# Patient Record
Sex: Male | Born: 1965 | Race: Black or African American | Hispanic: No | Marital: Married | Smoking: Never smoker
Health system: Southern US, Community
[De-identification: ages and names within clinical notes are randomized; demographics above are authoritative.]

## PROBLEM LIST (undated history)

## (undated) DIAGNOSIS — E119 Type 2 diabetes mellitus without complications: Secondary | ICD-10-CM

## (undated) HISTORY — PX: TRIGGER FINGER RELEASE: SHX641

---

## 2020-08-08 ENCOUNTER — Emergency Department (HOSPITAL_COMMUNITY)
Admission: EM | Admit: 2020-08-08 | Discharge: 2020-08-09 | Disposition: A | Discharge status: Home / Self Care | Payer: Federal, State, Local not specified - PPO | Attending: Emergency Medicine | Admitting: Emergency Medicine

## 2020-08-08 DIAGNOSIS — Z5321 Procedure and treatment not carried out due to patient leaving prior to being seen by health care provider: Secondary | ICD-10-CM | POA: Insufficient documentation

## 2020-08-08 DIAGNOSIS — S6991XA Unspecified injury of right wrist, hand and finger(s), initial encounter: Secondary | ICD-10-CM | POA: Insufficient documentation

## 2020-08-08 DIAGNOSIS — W07XXXA Fall from chair, initial encounter: Secondary | ICD-10-CM | POA: Diagnosis not present

## 2020-08-08 HISTORY — DX: Type 2 diabetes mellitus without complications: E11.9

## 2020-08-09 ENCOUNTER — Emergency Department (HOSPITAL_COMMUNITY): Payer: Federal, State, Local not specified - PPO

## 2020-08-09 ENCOUNTER — Encounter (HOSPITAL_COMMUNITY): Payer: Self-pay | Admitting: *Deleted

## 2020-08-09 NOTE — ED Triage Notes (Signed)
Pt tripped over a patio chair and landed onto his R hand. Swelling noted

## 2020-08-09 NOTE — ED Notes (Signed)
Called patient for vital update patient didn't answer 

## 2020-08-09 NOTE — ED Notes (Signed)
Pt is refusing vitals 

## 2022-05-15 IMAGING — CR DG HAND COMPLETE 3+V*R*
3 series · 3 of 3 positions shown · non-contrast
Comparison: None.

CLINICAL DATA: Fall fall landing on right hand, swelling abrasion,
pain of the second and third metacarpals.

EXAM:
RIGHT HAND - COMPLETE 3+ VIEW

[hand pa]
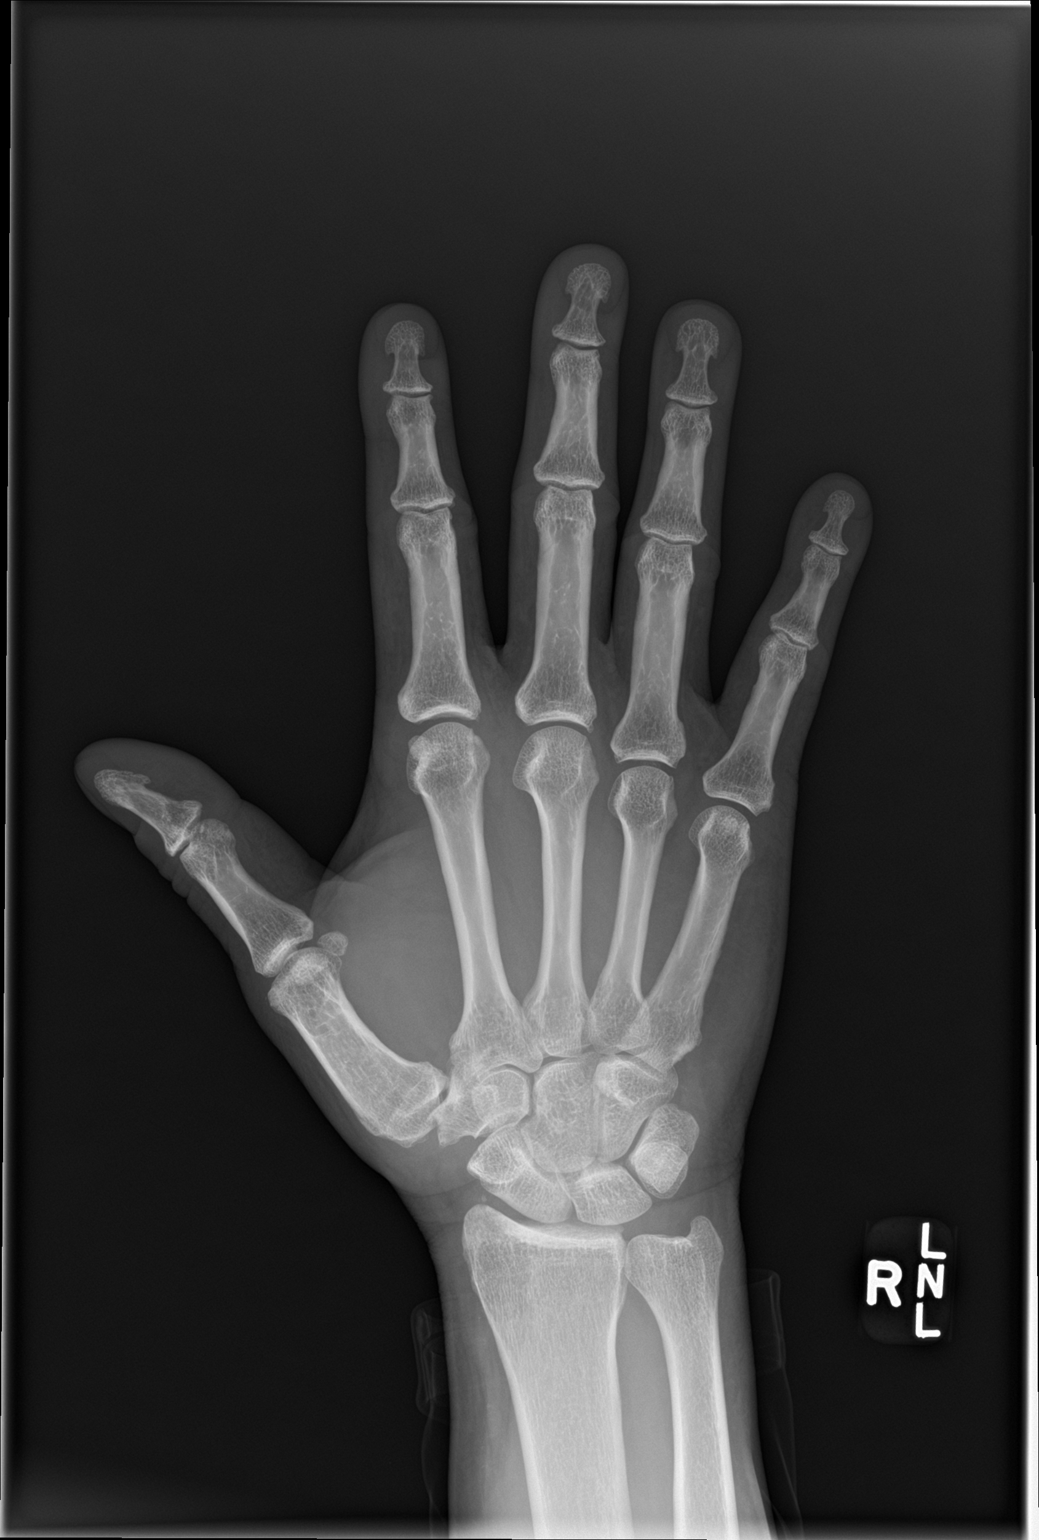

[hand obl]
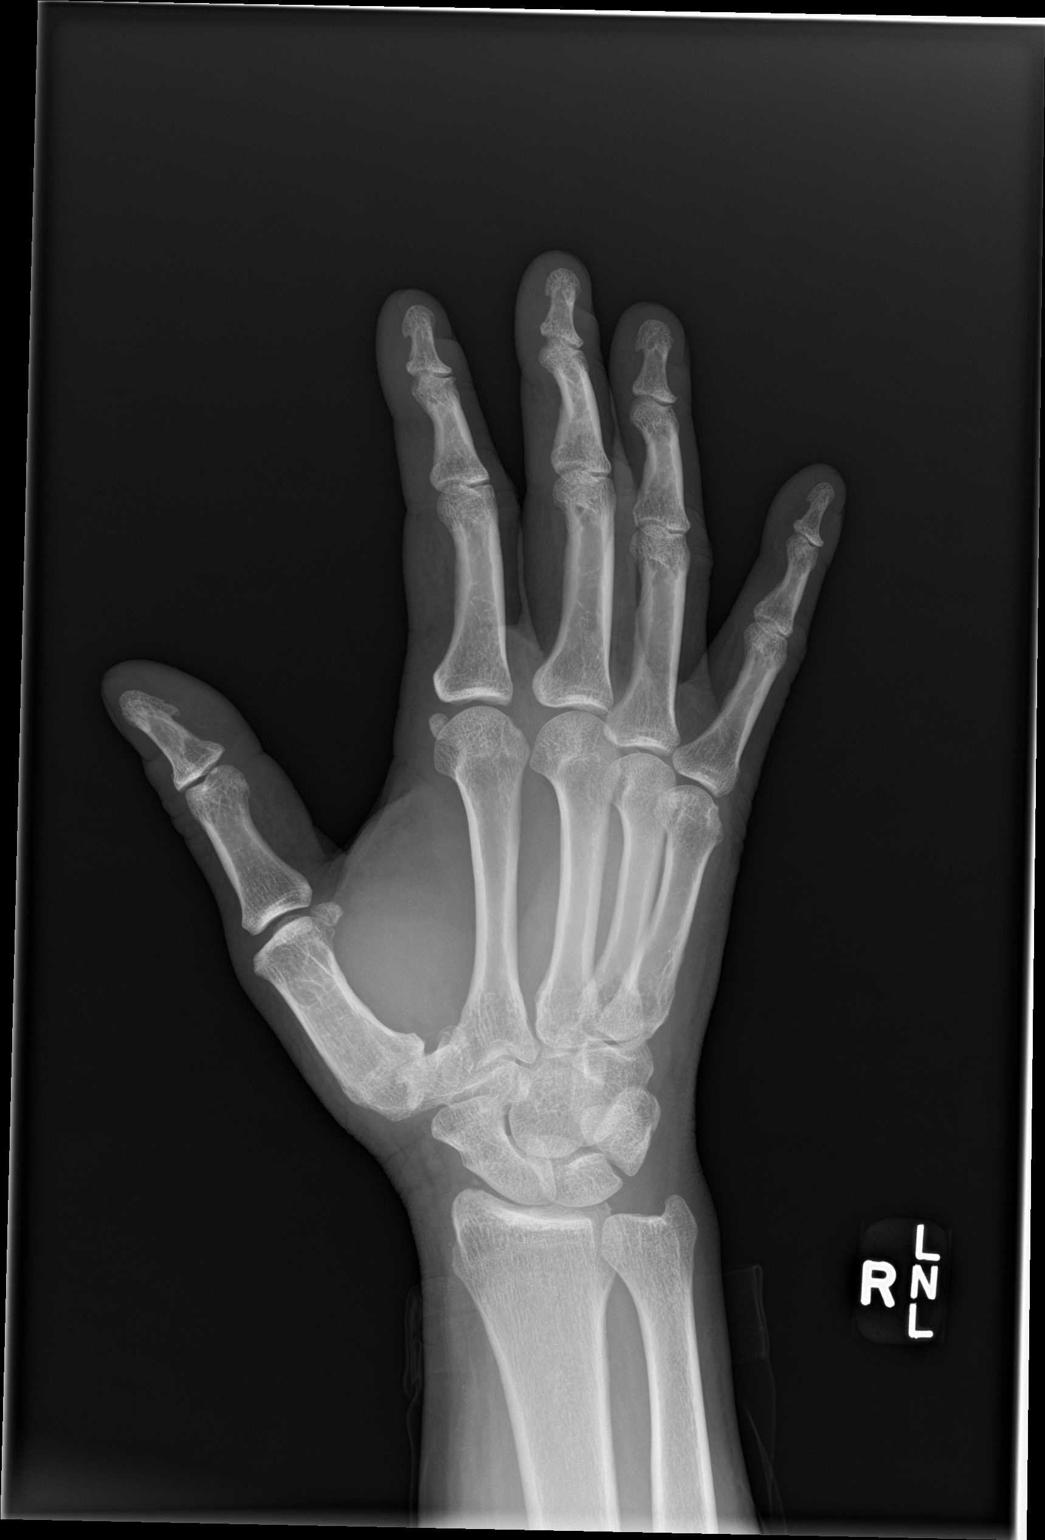

[hand lat]
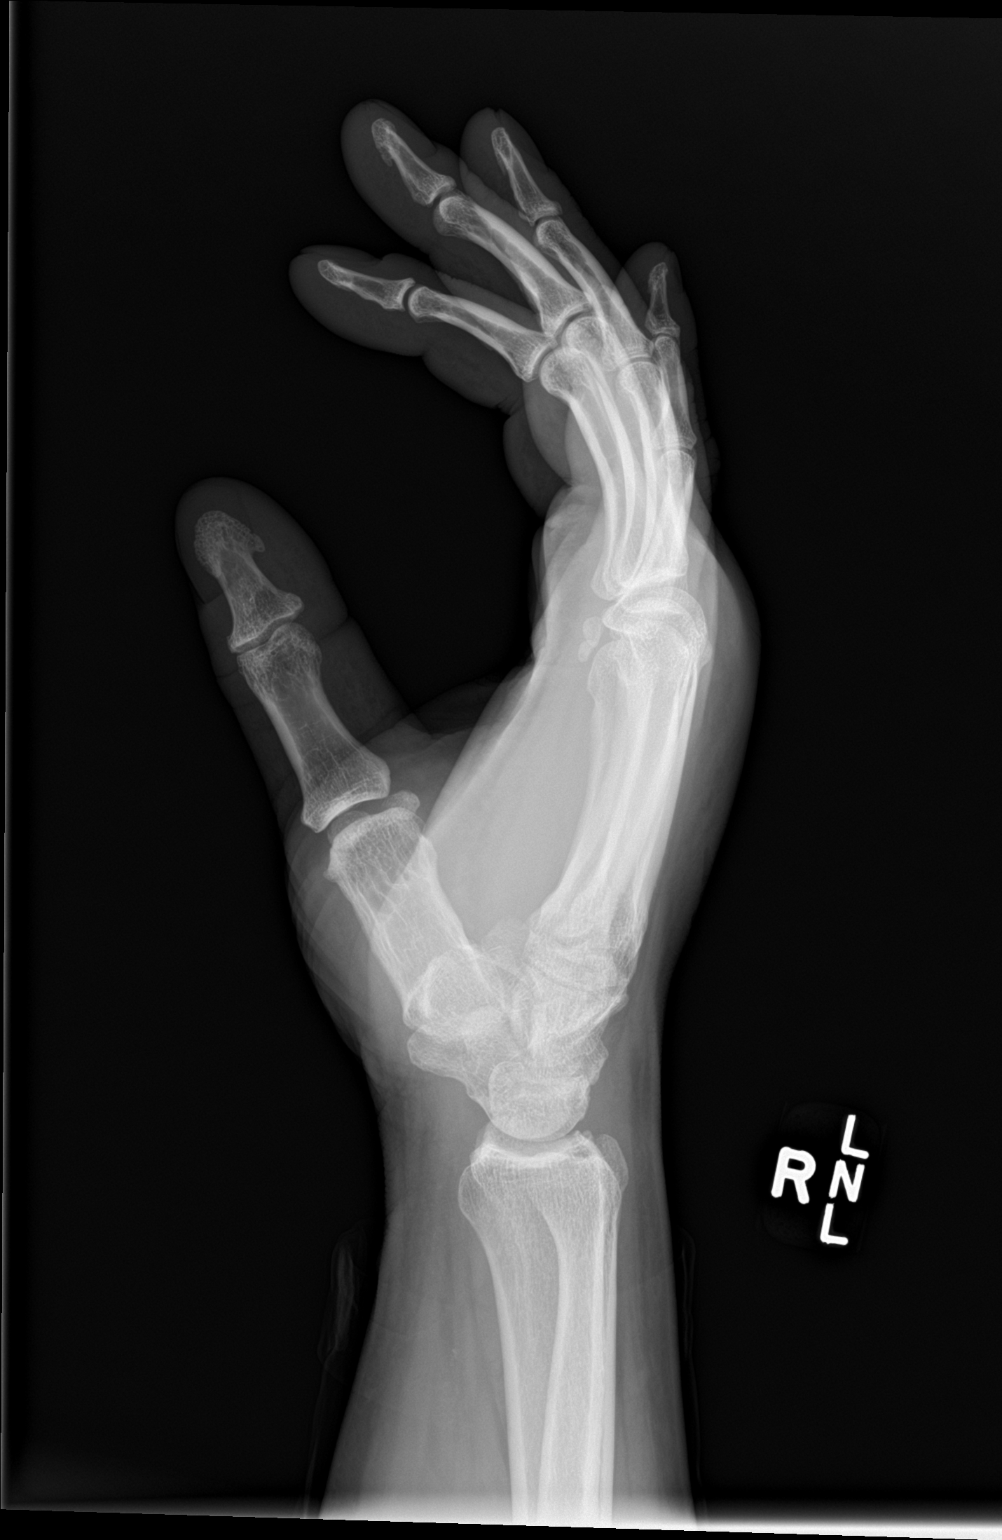

[3 of 3 positions shown; findings below may reference images not displayed]

FINDINGS: Tiny fragment seen adjacent the tip of the radial styloid could
reflect an acute cortical fragment or more degenerative change.
Correlate for point tenderness. No other acute osseous abnormality
or convincing site of fracture. Mild arthrosis of the hand and
wrist. There is no evidence of underlying inflammatory or erosive
arthropathy or other focal bone abnormality. Soft tissues are
unremarkable.
IMPRESSION: Tiny ossification adjacent the radial styloid process with adjacent
cortical lucency, could reflect a small acute fracture fragment
versus degenerative change.

No other acute fracture or traumatic malalignment.

Mild arthrosis.

## 2024-01-18 ENCOUNTER — Ambulatory Visit: Admitting: Podiatry

## 2024-01-30 ENCOUNTER — Ambulatory Visit: Admitting: Podiatry

## 2024-01-30 DIAGNOSIS — Q666 Other congenital valgus deformities of feet: Secondary | ICD-10-CM | POA: Diagnosis not present

## 2024-01-30 NOTE — Progress Notes (Signed)
"  °  Subjective:  Patient ID: William Richards, male    DOB: 1965-02-09,  MRN: [id number]  Chief Complaint  Patient presents with   Foot Pain    Pt stated that he has a lot of numbness and burning type pain     59 y.o. male presents with the above complaint.  Patient presents with complaint of diabetic neuropathy pain as well as arthritis pain.  Patient states that he is very flat-footed he is on his feet a lot denies any other acute complaints has not seen and was prior to seeing me.  He would like to discuss treatment options does not wear any orthotics he wears regular shoes.  Pain scale 7 out of 10 dull aching nature   Review of Systems: Negative except as noted in the HPI. Denies N/V/F/Ch.  Past Medical History:  Diagnosis Date   Diabetes mellitus without complication (HCC)    Current Medications[1]  Tobacco Use History[2]  Allergies[3] Objective:  There were no vitals filed for this visit. There is no height or weight on file to calculate BMI. Constitutional Well developed. Well nourished.  Vascular Dorsalis pedis pulses palpable bilaterally. Posterior tibial pulses palpable bilaterally. Capillary refill normal to all digits.  No cyanosis or clubbing noted. Pedal hair growth normal.  Neurologic Normal speech. Oriented to person, place, and time. Epicritic sensation to light touch grossly present bilaterally.  Dermatologic Nails well groomed and normal in appearance. No open wounds. No skin lesions.  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  Pain on palpation of left first metatarsophalangeal joint with underlying arthritis   Radiographs: None Assessment:   1. Pes planovalgus    Plan:  Patient was evaluated and treated and all questions answered.  Pes planovalgus/foot deformity -I explained to patient  the etiology of pes planovalgus and relationship with heel pain/arch pain and various treatment options were discussed.  Given patient foot structure in the setting of heel pain/arch pain I believe patient will benefit from custom-made orthotics to help control the hindfoot motion support the arch of the foot and take the stress away from arches.  Patient agrees with the plan like to proceed with orthotics -Patient was casted for orthotics with bilateral Morton's extension due to arthritis   No follow-ups on file.     [1] No current outpatient medications on file. [2]  Social History Tobacco Use  Smoking Status Never  Smokeless Tobacco Not on file  [3] No Known Allergies  "

## 2024-02-18 ENCOUNTER — Ambulatory Visit: Admitting: Sports Medicine

## 2024-02-28 ENCOUNTER — Ambulatory Visit: Admitting: Sports Medicine
